# Patient Record
Sex: Male | Born: 1995 | Race: White | Hispanic: No | Marital: Single | Smoking: Never smoker
Health system: Southern US, Community
[De-identification: ages and names within clinical notes are randomized; demographics above are authoritative.]

---

## 2018-10-08 ENCOUNTER — Emergency Department
Admission: EM | Admit: 2018-10-08 | Discharge: 2018-10-08 | Disposition: A | Discharge status: Home / Self Care | Payer: PRIVATE HEALTH INSURANCE | Attending: Emergency Medicine | Admitting: Emergency Medicine

## 2018-10-08 ENCOUNTER — Emergency Department: Payer: PRIVATE HEALTH INSURANCE

## 2018-10-08 ENCOUNTER — Encounter: Payer: Self-pay | Admitting: Emergency Medicine

## 2018-10-08 ENCOUNTER — Other Ambulatory Visit: Payer: Self-pay

## 2018-10-08 DIAGNOSIS — Y999 Unspecified external cause status: Secondary | ICD-10-CM | POA: Insufficient documentation

## 2018-10-08 DIAGNOSIS — S8254XA Nondisplaced fracture of medial malleolus of right tibia, initial encounter for closed fracture: Secondary | ICD-10-CM | POA: Diagnosis not present

## 2018-10-08 DIAGNOSIS — Y9366 Activity, soccer: Secondary | ICD-10-CM | POA: Diagnosis not present

## 2018-10-08 DIAGNOSIS — Y929 Unspecified place or not applicable: Secondary | ICD-10-CM | POA: Insufficient documentation

## 2018-10-08 DIAGNOSIS — S99911A Unspecified injury of right ankle, initial encounter: Secondary | ICD-10-CM | POA: Diagnosis present

## 2018-10-08 DIAGNOSIS — X58XXXA Exposure to other specified factors, initial encounter: Secondary | ICD-10-CM | POA: Insufficient documentation

## 2018-10-08 DIAGNOSIS — Z91010 Allergy to peanuts: Secondary | ICD-10-CM | POA: Insufficient documentation

## 2018-10-08 NOTE — Discharge Instructions (Addendum)
Please schedule an appointment with Dr. Alberteen Spindle. Avoid weight bearing--use your crutches. Return to the ER for symptoms of concern if unable to schedule an appointment.

## 2018-10-08 NOTE — ED Triage Notes (Signed)
Presents via ems   States he rolled his right ankle last pm while playing soccer  Increased pain this am and unable to bear wt  Good pulses noted

## 2018-10-09 NOTE — ED Provider Notes (Signed)
[facility] Emergency Department Provider Note ____________________________________________  Time seen: Approximately 11:22 AM  I have reviewed the triage vital signs and the nursing notes.   HISTORY  Chief Complaint Ankle Pain    HPI Carold Eisner is a 22 y.o. male who presents to the emergency department for evaluation and treatment of right ankle pain after and eversion injury while playing soccer yesterday evening. He tried to walk to class this morning and had severe pain with ambulation. No previous ankle surgeries or injuries.    History reviewed. No pertinent past medical history.  There are no active problems to display for this patient.   History reviewed. No pertinent surgical history.  Prior to Admission medications   Not on File    Allergies Peanut-containing drug products  No family history on file.  Social History Social History   Tobacco Use  . Smoking status: Never Smoker  . Smokeless tobacco: Never Used  Substance Use Topics  . Alcohol use: Never    Frequency: Never  . Drug use: Never    Review of Systems Constitutional: Negative for fever. Cardiovascular: Negative for chest pain. Respiratory: Negative for shortness of breath. Musculoskeletal: Positive for right ankle pain and swelling Skin: Negative for open wound  Neurological: Negative for decrease in sensation  ____________________________________________   PHYSICAL EXAM:  VITAL SIGNS: ED Triage Vitals  Enc Vitals Group     BP 10/08/18 0910 123/64     Pulse Rate 10/08/18 0910 75     Resp 10/08/18 0910 16     Temp 10/08/18 0910 98.4 F (36.9 C)     Temp Source 10/08/18 0910 Oral     SpO2 10/08/18 0910 99 %     Weight 10/08/18 0911 158 lb 11.7 oz (72 kg)     Height 10/08/18 0911 6' (1.829 m)     Head Circumference --      Peak Flow --      Pain Score 10/08/18 0911 6     Pain Loc --      Pain Edu? --      Excl. in GC? --     Constitutional: Alert  and oriented. Well appearing and in no acute distress. Eyes: Conjunctivae are clear without discharge or drainage Head: Atraumatic Neck: Supple Respiratory: No cough. Respirations are even and unlabored. Musculoskeletal: Tenderness of the ankle with focal tenderness over the medial aspect. No obvious deformity. Neurologic: Motor and sensory function is intact.  Skin: Intact  Psychiatric: Affect and behavior are appropriate.  ____________________________________________   LABS (all labs ordered are listed, but only abnormal results are displayed)  Labs Reviewed - No data to display ____________________________________________  RADIOLOGY  Minimally displaced medial malleolar tip fracture. ____________________________________________   PROCEDURES  .Splint Application Date/Time: 10/09/2018 1:42 PM Performed by: Suezanne Jacquet, NT Authorized by: Chinita Pester, FNP   Consent:    Consent obtained:  Verbal   Consent given by:  Patient   Risks discussed:  Numbness, pain and swelling Pre-procedure details:    Sensation:  Normal Procedure details:    Laterality:  Right   Location:  Ankle   Splint type:  Ankle stirrup   Supplies:  Ortho-Glass, elastic bandage and cotton padding Post-procedure details:    Pain:  Unchanged   Sensation:  Normal   Patient tolerance of procedure:  Tolerated well, no immediate complications  ____________________________________________   INITIAL IMPRESSION / ASSESSMENT AND PLAN / ED COURSE  Conard Alvira is a 22 y.o. who presents  to the emergency department for treatment and evaluation after an eversion injury to the right ankle last night. X-ray shows small medial malleolus fracture. He was placed in an OCL and given crutches. He will call and make an appointment with podiatry. He is to return to the ER for symptoms that change or worsen if unable to schedule an appointment.  Medications - No data to display  Pertinent labs & imaging  results that were available during my care of the patient were reviewed by me and considered in my medical decision making (see chart for details).  _________________________________________   FINAL CLINICAL IMPRESSION(S) / ED DIAGNOSES  Final diagnoses:  Closed nondisplaced fracture of medial malleolus of right tibia, initial encounter    ED Discharge Orders    None       If controlled substance prescribed during this visit, 12 month history viewed on the [facility] prior to issuing an initial prescription for Schedule II or III opiod.    Chinita Pester, FNP 10/09/18 1345    Governor Rooks, MD 10/10/18 [phone]

## 2019-11-21 IMAGING — DX DG ANKLE COMPLETE 3+V*R*
3 series · 3 of 3 positions shown · non-contrast
Comparison: None.

CLINICAL DATA: Medial ankle pain for 2 days following twisting
injury, initial encounter

EXAM:
RIGHT ANKLE - COMPLETE 3+ VIEW

[ankle ap]
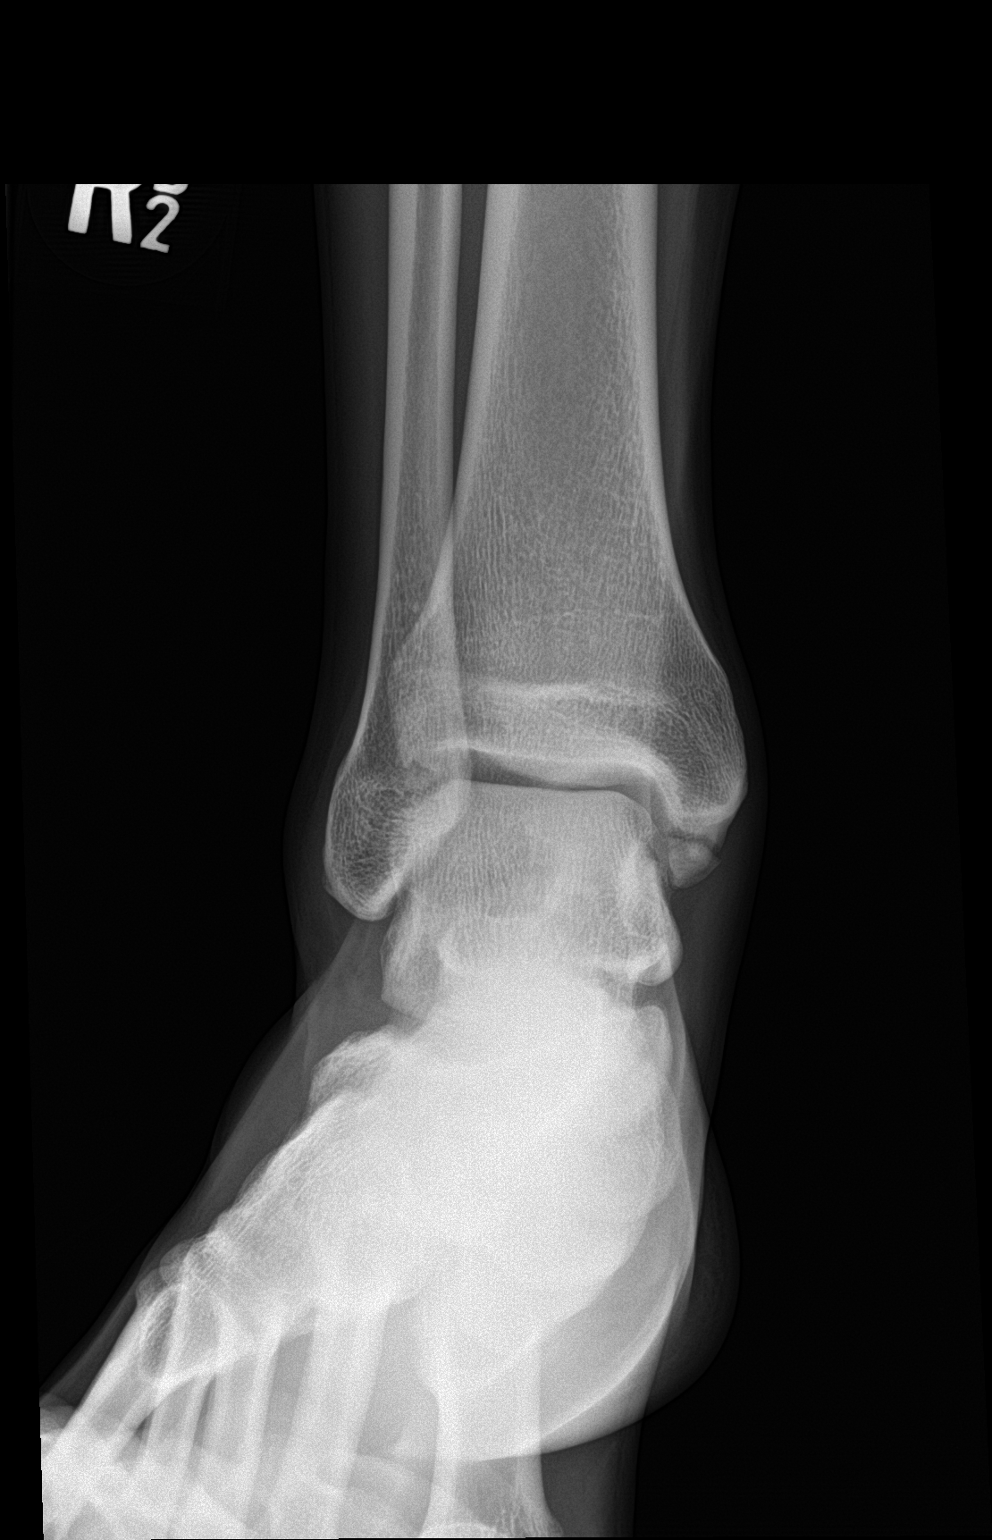

[ankle obl]
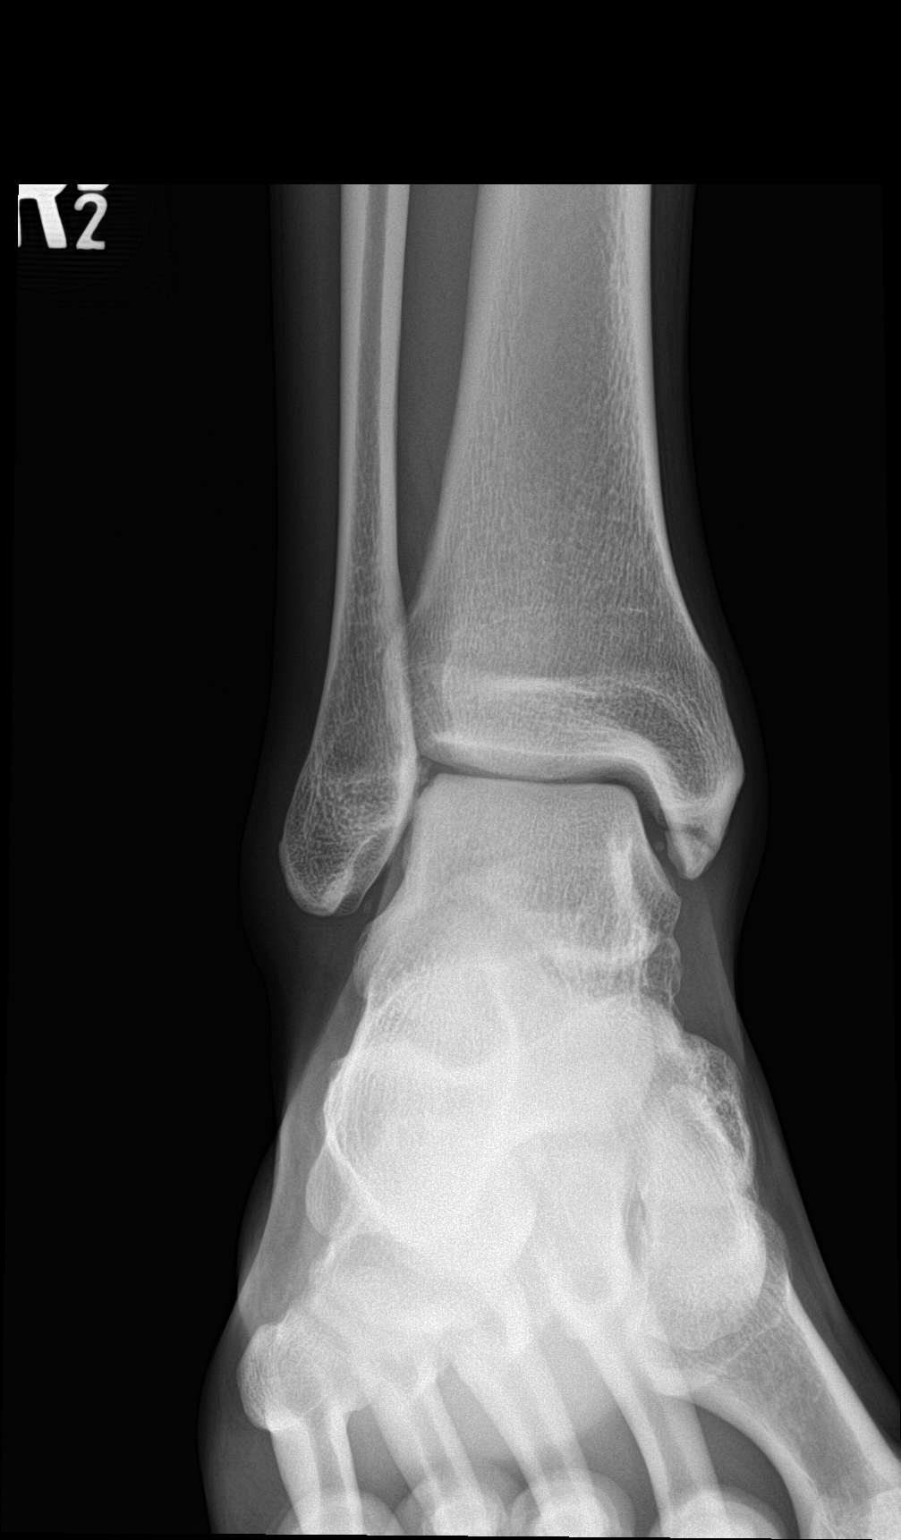

[ankle lat]
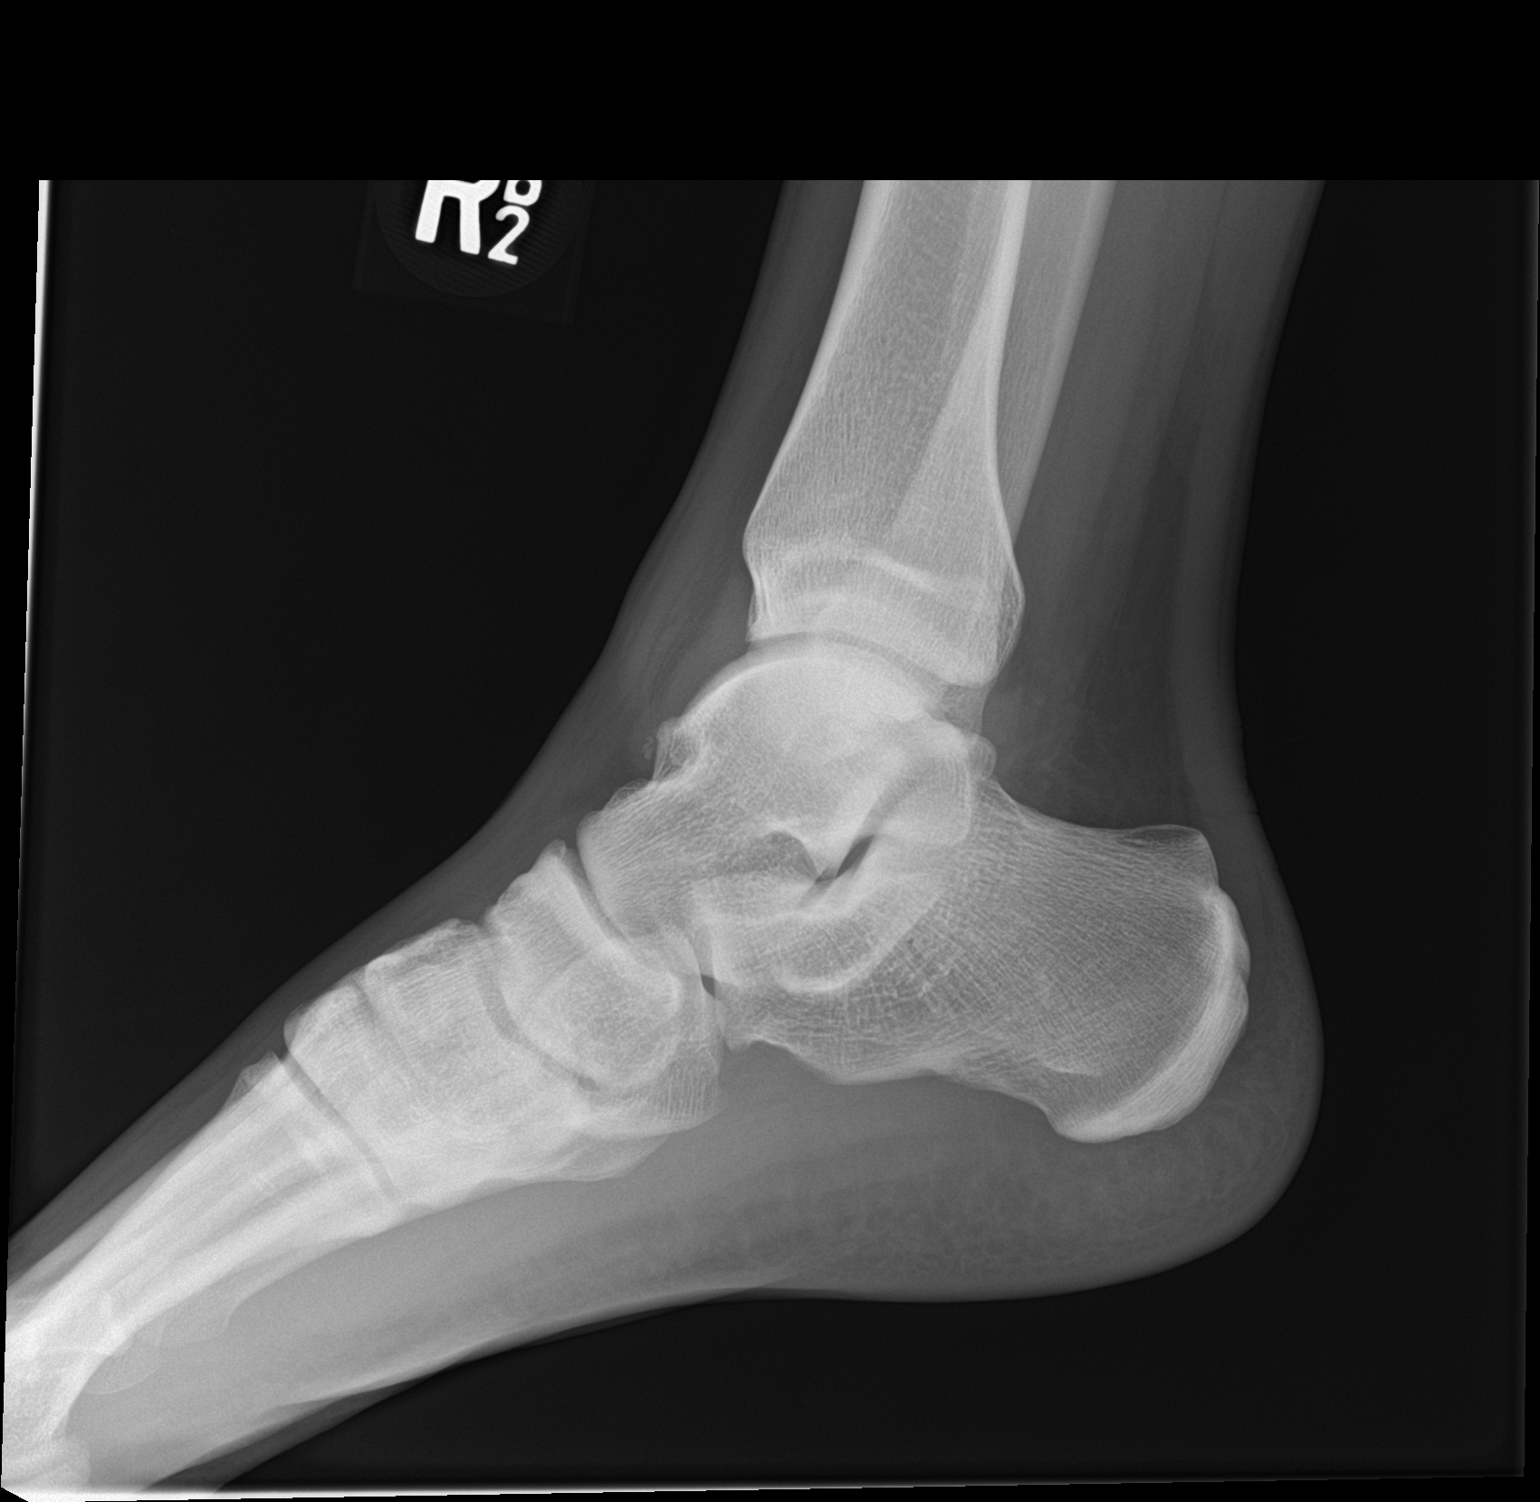

[3 of 3 positions shown; findings below may reference images not displayed]

FINDINGS: Soft tissue swelling is noted medially. There is a minimally
displaced medial malleolar fracture identified. Distal fibula is
within normal limits. No other focal abnormality is seen.
IMPRESSION: Minimally displaced medial malleolar tip fracture

## 2020-03-06 ENCOUNTER — Ambulatory Visit: Payer: PRIVATE HEALTH INSURANCE | Attending: Internal Medicine

## 2020-03-06 DIAGNOSIS — Z23 Encounter for immunization: Secondary | ICD-10-CM

## 2020-03-06 NOTE — Progress Notes (Signed)
   Covid-19 Vaccination Clinic  Name:  Michael Leonard    MRN: [id number] DOB: 1995-12-21  03/06/2020  [name] was observed post Covid-19 immunization for 15 minutes without incident. He was provided with Vaccine Information Sheet and instruction to access the V-Safe system.   [name] was instructed to call 911 with any severe reactions post vaccine: Marland Kitchen Difficulty breathing  . Swelling of face and throat  . A fast heartbeat  . A bad rash all over body  . Dizziness and weakness   Immunizations Administered    Name Date Dose VIS Date Route   Pfizer COVID-19 Vaccine 03/06/2020 10:10 AM 0.3 mL 11/26/2019 Intramuscular   Manufacturer: [employer], [employer]   Lot: [id number]   NDC: [address]-4

## 2020-03-27 ENCOUNTER — Ambulatory Visit: Payer: PRIVATE HEALTH INSURANCE | Attending: Internal Medicine

## 2020-03-27 DIAGNOSIS — Z23 Encounter for immunization: Secondary | ICD-10-CM

## 2020-03-27 NOTE — Progress Notes (Signed)
   Covid-19 Vaccination Clinic  Name:  Divon Krabill    MRN: [id number] DOB: 01/15/1996  03/27/2020  Mr. Hyams was observed post Covid-19 immunization for 15 minutes without incident. He was provided with Vaccine Information Sheet and instruction to access the V-Safe system.   Mr. Shughart was instructed to call 911 with any severe reactions post vaccine: Marland Kitchen Difficulty breathing  . Swelling of face and throat  . A fast heartbeat  . A bad rash all over body  . Dizziness and weakness   Immunizations Administered    Name Date Dose VIS Date Route   Pfizer COVID-19 Vaccine 03/27/2020 10:04 AM 0.3 mL 11/26/2019 Intramuscular   Manufacturer: [employer], [employer]   Lot: [id number]   NDC: [address]-4
